# Patient Record
Sex: Female | Born: 1995 | Race: Black or African American | Hispanic: No | Marital: Single | Smoking: Never smoker
Health system: Southern US, Community
[De-identification: ages and names within clinical notes are randomized; demographics above are authoritative.]

---
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
## 2017-06-20 NOTE — Progress Notes (Signed)
[facility] - Clinic Note  06/21/2017     CHIEF COMPLAINT Patient presents for Retina Evaluation   HISTORY OF PRESENT ILLNESS: Heidi Cantu is a 22 y.o. female who presents to the clinic today for:   HPI    Retina Evaluation    In right eye.  Duration of 3 days.  Associated Symptoms Pain.  Negative for Flashes, Blind Spot, Photophobia, Scalp Tenderness, Fever, Floaters, Glare, Jaw Claudication, Weight Loss, Distortion, Redness, Trauma, Shoulder/Hip pain and Fatigue.  Treatments tried include no treatments.          Comments    Pt presents today for retinal eval on the referral of Dr. Laruth BouchardS Groat for possible retinal tear, pt states she had routine exam with Dr. Dione BoozeGroat on Tuesday, pt denies flashes, floaters or wavy vision, but is currently experiencing pain OD, but may be link to sinus headache she has, pt denies the use of gtts, pt is not diabetic, pt states she has been told she is glaucoma suspect,        Last edited by Posey BoyerBrown, Amanda J, COT on 06/21/2017 10:07 AM. (History)    Pt states she sees Dr. Dione BoozeGroat annually; Pt denies any blurred VA, denies floaters, denies flashes;   Referring physician: Olivia CanterGroat, Richard Scott, MD [address] [place], [place] [id number]  HISTORICAL INFORMATION:   Selected notes from the MEDICAL RECORD NUMBER Referred by Dr. Laruth BouchardS. Groat for concern on RT OD;  LEE-  Ocular Hx-  PMH-     CURRENT MEDICATIONS: No current outpatient medications on file. (Ophthalmic Drugs)   No current facility-administered medications for this visit.  (Ophthalmic Drugs)   Current Outpatient Medications (Other)  Medication Sig  . JUNEL FE 1/20 1-20 MG-MCG tablet TAKE 1 TABLET BY MOUTH EVERY DAY NEED TO SCHEDULE ANNUAL EXAM   No current facility-administered medications for this visit.  (Other)      REVIEW OF SYSTEMS: ROS    Positive for: Eyes   Negative for: Constitutional, Gastrointestinal, Neurological, Skin, Genitourinary, Musculoskeletal,  HENT, Endocrine, Cardiovascular, Respiratory, Psychiatric, Allergic/Imm, Heme/Lymph   Last edited by Posey BoyerBrown, Amanda J, COT on 06/21/2017 10:07 AM. (History)       ALLERGIES No Known Allergies  PAST MEDICAL HISTORY History reviewed. No pertinent past medical history. History reviewed. No pertinent surgical history.  FAMILY HISTORY Family History  Problem Relation Age of Onset  . Cataracts Paternal Grandfather   . Amblyopia Neg Hx   . Blindness Neg Hx   . Diabetes Neg Hx   . Glaucoma Neg Hx   . Hypertension Neg Hx   . Macular degeneration Neg Hx   . Retinal detachment Neg Hx   . Strabismus Neg Hx   . Retinitis pigmentosa Neg Hx     SOCIAL HISTORY Social History   Tobacco Use  . Smoking status: Never Smoker  . Smokeless tobacco: Never Used  Substance Use Topics  . Alcohol use: Not on file  . Drug use: Not on file         OPHTHALMIC EXAM:  Base Eye Exam    Visual Acuity (Snellen - Linear)      Right Left   Dist cc 20/25 -1 20/20 -1   Dist ph cc NI NI   Correction:  Glasses       Tonometry (Tonopen, 10:00 AM)      Right Left   Pressure  14  Right eye poured water, unable to get reading  Pupils      Dark Light Shape React APD   Right 4 3 Round Brisk None   Left 4 3 Round Brisk None       Visual Fields (Counting fingers)      Left Right    Full Full       Extraocular Movement      Right Left    Full, Ortho Full, Ortho       Neuro/Psych    Oriented x3:  Yes   Mood/Affect:  Normal       Dilation    Both eyes:  1.0% Mydriacyl, 2.5% Phenylephrine @ 10:53        Slit Lamp and Fundus Exam    Slit Lamp Exam      Right Left   Lids/Lashes Normal Normal   Conjunctiva/Sclera mild Melanosis mild Melanosis   Cornea Clear Clear   Anterior Chamber Deep and quiet, No cell or flare Deep and quiet, No cell or flare   Iris Round and widly dilated Round and dilated, slightly reactive   Lens Clear Clear   Vitreous Normal; prominent vitreous base  Normal; prominent vitreous base       Fundus Exam      Right Left   Disc Normal Normal   C/D Ratio 0.6 0.6   Macula Good foveal reflex, No heme or edema Good foveal reflex, No heme or edema   Vessels Normal Normal   Periphery Light patch of lattice at 0730, Attached, scattered patches of WWP peripherally; no RT or RD on 360 scleral depressed exam; prominent vitreous base Attached; no RT or RD 360; prominent vitreous base        Refraction    Wearing Rx      Sphere Cylinder Axis   Right -9.50 +1.50 085   Left -9.00 +1.50 092       Manifest Refraction      Sphere Cylinder Axis Dist VA   Right -9.00 +1.50 100 20/20   Left -9.00 +1.50 090 20/20  Done by Linward Headland AND PROCEDURES  Imaging and Procedures for 06/22/17  OCT, Retina - OU - Both Eyes     Right Eye Quality was good. Central Foveal Thickness: 254. Progression has no prior data. Findings include normal foveal contour, no IRF, no SRF.   Left Eye Quality was good. Central Foveal Thickness: 240. Progression has no prior data. Findings include normal foveal contour, no IRF, no SRF.   Notes *Images captured and stored on drive  Diagnosis / Impression:  NFP, No IRF/SRF OU  Clinical management:  See below  Abbreviations: NFP - Normal foveal profile. CME - cystoid macular edema. PED - pigment epithelial detachment. IRF - intraretinal fluid. SRF - subretinal fluid. EZ - ellipsoid zone. ERM - epiretinal membrane. ORA - outer retinal atrophy. ORT - outer retinal tubulation. SRHM - subretinal hyper-reflective material                  ASSESSMENT/PLAN:    ICD-10-CM   1. 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 Retinal edema H35.81 OCT, Retina - OU - Both Eyes    1. Very mild lattice degeneration OD - small patch inf temp periphery OD - no atrophic holes or tears and  asymptomatic - discussed findings, prognosis, and treatment options  -  recommend monitoring -- pt in agreement  2. White w/o pressure OU - very prominent vitreous base along with WWP - no RT/RD on 360 scleral depressed exam OU - monitor  3. High myopia - stable - dicussed risks associated with high myopia - management per Dr. Dione Booze  4. No retinal edema on exam or OCT   Ophthalmic Meds Ordered this visit:  No orders of the defined types were placed in this encounter.      Return if symptoms worsen or fail to improve.  There are no Patient Instructions on file for this visit.   Explained the diagnoses, plan, and follow up with the patient and they expressed understanding.  Patient expressed understanding of the importance of proper follow up care.   This document serves as a record of services personally performed by Karie Chimera, MD, PhD. It was created on their behalf by Virgilio Belling, COA, a certified ophthalmic assistant. The creation of this record is the provider's dictation and/or activities during the visit.  Electronically signed by: Virgilio Belling, COA  06/22/17 8:00 AM    Karie Chimera, M.D., Ph.D. Diseases & Surgery of the Retina and Vitreous [facility] 06/22/17  I have reviewed the above documentation for accuracy and completeness, and I agree with the above. Karie Chimera, M.D., Ph.D. 06/22/17 8:00 AM     Abbreviations: M myopia (nearsighted); A astigmatism; H hyperopia (farsighted); P presbyopia; Mrx spectacle prescription;  CTL contact lenses; OD right eye; OS left eye; OU both eyes  XT exotropia; ET esotropia; PEK punctate epithelial keratitis; PEE punctate epithelial erosions; DES dry eye syndrome; MGD meibomian gland dysfunction; ATs artificial tears; PFAT's preservative free artificial tears; NSC nuclear sclerotic cataract; PSC posterior subcapsular cataract; ERM epi-retinal membrane; PVD posterior vitreous detachment;  RD retinal detachment; DM diabetes mellitus; DR diabetic retinopathy; NPDR non-proliferative diabetic retinopathy; PDR proliferative diabetic retinopathy; CSME clinically significant macular edema; DME diabetic macular edema; dbh dot blot hemorrhages; CWS cotton wool spot; POAG primary open angle glaucoma; C/D cup-to-disc ratio; HVF humphrey visual field; GVF goldmann visual field; OCT optical coherence tomography; IOP intraocular pressure; BRVO Branch retinal vein occlusion; CRVO central retinal vein occlusion; CRAO central retinal artery occlusion; BRAO branch retinal artery occlusion; RT retinal tear; SB scleral buckle; PPV pars plana vitrectomy; VH Vitreous hemorrhage; PRP panretinal laser photocoagulation; IVK intravitreal kenalog; VMT vitreomacular traction; MH Macular hole;  NVD neovascularization of the disc; NVE neovascularization elsewhere; AREDS age related eye disease study; ARMD age related macular degeneration; POAG primary open angle glaucoma; EBMD epithelial/anterior basement membrane dystrophy; ACIOL anterior chamber intraocular lens; IOL intraocular lens; PCIOL posterior chamber intraocular lens; Phaco/IOL phacoemulsification with intraocular lens placement; PRK photorefractive keratectomy; LASIK laser assisted in situ keratomileusis; HTN hypertension; DM diabetes mellitus; COPD chronic obstructive pulmonary disease

## 2017-06-21 ENCOUNTER — Ambulatory Visit (INDEPENDENT_AMBULATORY_CARE_PROVIDER_SITE_OTHER): Payer: BC Managed Care – PPO | Admitting: Ophthalmology

## 2017-06-21 ENCOUNTER — Encounter (INDEPENDENT_AMBULATORY_CARE_PROVIDER_SITE_OTHER): Payer: Self-pay | Admitting: Ophthalmology

## 2017-06-21 DIAGNOSIS — H5213 Myopia, bilateral: Secondary | ICD-10-CM | POA: Diagnosis not present

## 2017-06-21 DIAGNOSIS — H3581 Retinal edema: Secondary | ICD-10-CM

## 2017-06-21 DIAGNOSIS — H359 Unspecified retinal disorder: Secondary | ICD-10-CM

## 2017-06-21 DIAGNOSIS — H35411 Lattice degeneration of retina, right eye: Secondary | ICD-10-CM

## 2017-06-22 ENCOUNTER — Encounter (INDEPENDENT_AMBULATORY_CARE_PROVIDER_SITE_OTHER): Payer: Self-pay | Admitting: Ophthalmology
